# Patient Record
Sex: Female | Born: 2000 | Race: Black or African American | Hispanic: No | Marital: Single | Smoking: Never smoker
Health system: Southern US, Community
[De-identification: ages and names within clinical notes are randomized; demographics above are authoritative.]

---
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
## 2024-05-25 ENCOUNTER — Encounter (HOSPITAL_COMMUNITY): Payer: Self-pay

## 2024-05-25 ENCOUNTER — Ambulatory Visit (HOSPITAL_COMMUNITY)
Admission: EM | Admit: 2024-05-25 | Discharge: 2024-05-25 | Disposition: A | Discharge status: Home / Self Care | Attending: Emergency Medicine | Admitting: Emergency Medicine

## 2024-05-25 DIAGNOSIS — S29011A Strain of muscle and tendon of front wall of thorax, initial encounter: Secondary | ICD-10-CM | POA: Diagnosis not present

## 2024-05-25 MED ORDER — BACLOFEN 10 MG PO TABS: 10.0000 mg | ORAL_TABLET | Freq: Three times a day (TID) | ORAL | 0 refills | Status: AC

## 2024-05-25 NOTE — Discharge Instructions (Signed)
 I believe your symptoms are likely muscular in nature. You can take baclofen  every 8 hours as needed for muscle pain and spasms. Alternate between 650 mg of Tylenol and 400 mg of ibuprofen every 6-8 hours as needed for pain. You can also alternate between ice and heat as needed for pain. Follow-up with your primary care provider or return here as needed. If you develop worsening chest pain, trouble breathing, dizziness, weakness, numbness, or pain begins to radiate up to your neck, jaw, down your arm, or to your back please go to the emergency department for further evaluation.

## 2024-05-25 NOTE — ED Triage Notes (Signed)
 Patient here today with c/o chest pain since Thursday. Patient states that the pain worsened yesterday. Patient tried taking a medication for congestion with no relief. Patient states that she has increased pain with moving in different positions. Taking deep breaths increased the pain as well. Patient thinks it may be muscular.

## 2024-05-25 NOTE — ED Provider Notes (Signed)
 [facility]    CSN: [id number] Arrival date & time: 05/25/24  1049      History   Chief Complaint Chief Complaint  Patient presents with   Chest Pain    HPI Dana Walton is a 23 y.o. female.   Patient presents with central chest pain that began on 8/28.  Patient states that the pain has progressively worsened over the last few days.  Denies cough, congestion, shortness of breath, headache, sore throat, fever, body aches, chills, dizziness, weakness, numbness, and back pain.  Patient reports that she did go to the gym earlier this week and was doing some lifting and then began to have pain shortly after this.  Patient states that her pain is worse with movement and when she takes a deep breath.  Patient states that she picked up some medication for congestion and took this without relief.  Patient denies taking medication for pain.  The history is provided by the patient and medical records.  Chest Pain   History reviewed. No pertinent past medical history.  There are no active problems to display for this patient.   History reviewed. No pertinent surgical history.  OB History   No obstetric history on file.      Home Medications    Prior to Admission medications   Medication Sig Start Date End Date Taking? Authorizing Provider  baclofen  (LIORESAL ) 10 MG tablet Take 1 tablet (10 mg total) by mouth 3 (three) times daily. 05/25/24  Yes Johnie Rumaldo LABOR, NP    Family History Family History  Problem Relation Age of Onset   Healthy Mother    Healthy Father     Social History Social History   Tobacco Use   Smoking status: Never   Smokeless tobacco: Never  Vaping Use   Vaping status: Never Used  Substance Use Topics   Alcohol use: Yes    Comment: special occasions   Drug use: Never     Allergies   Patient has no known allergies.   Review of Systems Review of Systems  Cardiovascular:  Positive for chest pain.   Per HPI  Physical  Exam Triage Vital Signs ED Triage Vitals  Encounter Vitals Group     BP 05/25/24 1204 101/66     Girls Systolic BP Percentile --      Girls Diastolic BP Percentile --      Boys Systolic BP Percentile --      Boys Diastolic BP Percentile --      Pulse Rate 05/25/24 1204 67     Resp 05/25/24 1204 16     Temp 05/25/24 1204 98.7 F (37.1 C)     Temp Source 05/25/24 1204 Oral     SpO2 05/25/24 1204 98 %     Weight --      Height --      Head Circumference --      Peak Flow --      Pain Score 05/25/24 1207 9     Pain Loc --      Pain Education --      Exclude from Growth Chart --    No data found.  Updated Vital Signs BP 101/66 (BP Location: Left Arm)   Pulse 67   Temp 98.7 F (37.1 C) (Oral)   Resp 16   LMP 05/04/2024 (Approximate)   SpO2 98%   Visual Acuity Right Eye Distance:   Left Eye Distance:   Bilateral Distance:    Right Eye Near:  Left Eye Near:    Bilateral Near:     Physical Exam Vitals and nursing note reviewed.  Constitutional:      General: She is awake. She is not in acute distress.    Appearance: Normal appearance. She is well-developed and well-groomed. She is not ill-appearing.  HENT:     Nose: Nose normal.     Mouth/Throat:     Mouth: Mucous membranes are moist.     Pharynx: Oropharynx is clear.  Cardiovascular:     Rate and Rhythm: Normal rate and regular rhythm.  Pulmonary:     Effort: Pulmonary effort is normal.     Breath sounds: Normal breath sounds.  Chest:     Chest wall: Tenderness present. No mass, deformity, swelling, crepitus or edema.       Comments: TTP to central chest Musculoskeletal:        General: Normal range of motion.     Cervical back: Normal range of motion and neck supple.  Skin:    General: Skin is warm and dry.  Neurological:     General: No focal deficit present.     Mental Status: She is alert and oriented to person, place, and time. Mental status is at baseline.  Psychiatric:        Behavior: Behavior  is cooperative.      UC Treatments / Results  Labs (all labs ordered are listed, but only abnormal results are displayed) Labs Reviewed - No data to display  EKG   Radiology No results found.  Procedures Procedures (including critical care time)  Medications Ordered in UC Medications - No data to display  Initial Impression / Assessment and Plan / UC Course  I have reviewed the triage vital signs and the nursing notes.  Pertinent labs & imaging results that were available during my care of the patient were reviewed by me and considered in my medical decision making (see chart for details).     Patient is overall well-appearing.  Vitals are stable.  Based on exam findings chest pain likely muscular in nature.  EKG revealed normal sinus rhythm without ST elevation, depression, or acute cardiac findings.  No other significant findings on exam.  Prescribed baclofen  as needed for muscle pain and spasms.  Recommended Tylenol and ibuprofen as needed for pain.  Discussed follow-up, return, and strict ER precautions. Final Clinical Impressions(s) / UC Diagnoses   Final diagnoses:  Chest wall muscle strain, initial encounter     Discharge Instructions      I believe your symptoms are likely muscular in nature. You can take baclofen  every 8 hours as needed for muscle pain and spasms. Alternate between 650 mg of Tylenol and 400 mg of ibuprofen every 6-8 hours as needed for pain. You can also alternate between ice and heat as needed for pain. Follow-up with your primary care provider or return here as needed. If you develop worsening chest pain, trouble breathing, dizziness, weakness, numbness, or pain begins to radiate up to your neck, jaw, down your arm, or to your back please go to the emergency department for further evaluation.   ED Prescriptions     Medication Sig Dispense Auth. Provider   baclofen  (LIORESAL ) 10 MG tablet Take 1 tablet (10 mg total) by mouth 3 (three)  times daily. 30 each Johnie Rumaldo LABOR, NP      PDMP not reviewed this encounter.   Johnie Rumaldo A, NP 05/25/24 [phone]
